# Patient Record
Sex: Female | Born: 1935 | Race: White | Hispanic: No | Marital: Married | Smoking: Never smoker
Health system: Southern US, Community
[De-identification: ages and names within clinical notes are randomized; demographics above are authoritative.]

## PROBLEM LIST (undated history)

## (undated) DIAGNOSIS — G459 Transient cerebral ischemic attack, unspecified: Secondary | ICD-10-CM

## (undated) HISTORY — PX: JOINT REPLACEMENT: SHX530

---

## 1998-05-20 ENCOUNTER — Other Ambulatory Visit: Admission: RE | Admit: 1998-05-20 | Discharge: 1998-05-20 | Payer: Self-pay | Admitting: Endocrinology

## 2005-05-28 ENCOUNTER — Emergency Department (HOSPITAL_COMMUNITY): Admission: EM | Admit: 2005-05-28 | Discharge: 2005-05-28 | Payer: Self-pay | Admitting: Emergency Medicine

## 2005-05-28 ENCOUNTER — Inpatient Hospital Stay (HOSPITAL_COMMUNITY): Admission: RE | Admit: 2005-05-28 | Discharge: 2005-06-01 | Payer: Self-pay | Admitting: Orthopedic Surgery

## 2005-05-28 ENCOUNTER — Ambulatory Visit: Payer: Self-pay | Admitting: Cardiology

## 2005-05-31 ENCOUNTER — Ambulatory Visit: Payer: Self-pay | Admitting: Physical Medicine & Rehabilitation

## 2005-06-01 ENCOUNTER — Inpatient Hospital Stay (HOSPITAL_COMMUNITY)
Admission: RE | Admit: 2005-06-01 | Discharge: 2005-06-10 | Payer: Self-pay | Admitting: Physical Medicine & Rehabilitation

## 2007-10-06 ENCOUNTER — Emergency Department (HOSPITAL_BASED_OUTPATIENT_CLINIC_OR_DEPARTMENT_OTHER): Admission: EM | Admit: 2007-10-06 | Discharge: 2007-10-06 | Payer: Self-pay | Admitting: Emergency Medicine

## 2007-10-10 ENCOUNTER — Ambulatory Visit (HOSPITAL_COMMUNITY): Admission: RE | Admit: 2007-10-10 | Discharge: 2007-10-10 | Payer: Self-pay | Admitting: General Surgery

## 2008-03-13 ENCOUNTER — Encounter: Admission: RE | Admit: 2008-03-13 | Discharge: 2008-03-13 | Payer: Self-pay | Admitting: General Surgery

## 2010-06-02 NOTE — Op Note (Signed)
[name], [name]                  ACCOUNT NO.:  [id number]   MEDICAL RECORD NO.:  [id number]          PATIENT TYPE:  AMB   LOCATION:  SDS                          FACILITY:  [facility]   PHYSICIAN:  Johnette Abraham, MD    DATE OF BIRTH:  04-22-1922   DATE OF PROCEDURE:  10/10/2007  DATE OF DISCHARGE:                               OPERATIVE REPORT   PREOPERATIVE DIAGNOSES:  1. Fracture of the left distal radius.  2. Fracture of the left distal ulna.  3. Median nerve compression.   POSTOPERATIVE DIAGNOSES:  1. Fracture of the left distal radius.  2. Fracture of the left distal ulna.  3. Median nerve compression.   PROCEDURES:  1. Open reduction and internal fixation of the left distal radius with      a volar Stryker VariAx plate.  2. Open reduction and internal fixation of the left distal ulna with a      plate and screws.  3. Open carpal tunnel release.  4. Intraoperative x-ray and fluoroscopy.   SURGEON:  Harrill C. Izora Ribas, MD   ASSISTANTS:  None.   ANESTHESIA:  General.   FINDINGS:  Comminuted intra-articular multi-part distal radius fracture,  comminuted multi-part distal ulnar fracture.   No specimens.   ESTIMATED BLOOD LOSS:  15 mL.   TOURNIQUET TIME:  Just under 2 hours.   No acute complications.   INDICATIONS:  [name] is an 77 year old female who fell on  outstretched hand sustaining a severe fracture to her left distal radius  and ulna.  X-ray examinations revealed comminuted displaced and  osteopenic fracture and bone, this was felt in need of operative  fixation.  Risks, benefits, and alternatives of surgery were discussed  with the patient including swelling, nerve compression, stiff motion,  and fracture nonunion.  The patient agreed to proceed with surgery.  Consent was obtained.   PROCEDURE:  The patient was taken to the operating room, placed supine  on the operating room table.  General anesthesia was administered.  Of  note, the patient had an  infraclavicular block prior to the procedure by  Anesthesia.  After general anesthesia was administered, the left upper  extremity was prepped and draped in normal sterile fashion.  The arm was  elevated and exsanguinated.  Tourniquet was inflated to 250 mmHg.  An  incision approximately 8 cm long was made on the wrist overlying the  flexor carpi radialis tendon.  There was quite a bit of edema  encountered on the dissection.  The flexor carpi radialis tendon was  retracted ulnarly.  The fascia was then opened and the fracture site was  exposed.  The fracture was in multiple parts with a large radial styloid  fragment.  It appeared to be intra-articular.  The bone was very  brittle.  Reduction was assisted with placement of a K-wire through the  radial styloid.  Following an appropriate size Stryker VariAx volar  plate was placed.  X-ray examination revealed central placement on the  shaft and the central screw was drilled and appropriate size screw was  placed.  Additional x-ray views were performed and the plate adjusted to  provide adequate coverage of the fracture and central placement of the  plate.  Once this was performed, additional holes were drilled in the  radial shaft and cortical screws were placed according to the depth  measurements.  Following with the dorsal pressure to reduce the fracture  as well as radial pressure, the distal holes of the plate were drilled,  appropriate size locking screws were placed.  Of note, the bone quality  here was very, very poor and this was why locking screws were used.  Several x-ray examinations revealed good reduction of the fracture and  adequate placement of all screws.  Afterwards the K-wire was removed  from the radial styloid and stable fixation was obtained.  Following the  distal ulnar fracture was palpated, an incision along the midline from  the ulnar styloid proximally for about 4 cm was created.  Care was taken  during the  dissection to avoid any neurovascular structures.  The ulnar  shaft itself was exposed, the fracture site was exposed, it was  irrigated and reduced.  A small radial styloid plate was used and  contoured to fit along the portion of the radius.  X-ray views  demonstrated good plate placement.  Fracture reduction was aided with  temporary K-wire placement following the plate was secured with both  cortical and locking screws that were each measured to depth.  Several x-  ray reviews revealed good reduction of the fragment and adequate length  and placement of all screws.  Afterwards because of the extensive  comminution and swelling, an open carpal tunnel release was performed.  A curvilinear incision was made just ulnar to the opponens flexion  crease.  Dissection was carried down to the palmar fascia.  This was  sharply incised visualizing the distal aspect of the transverse carpal  ligament.  Under direct visualization, this ligament was incised from  distal to proximal.  A Freer elevator was used up underneath the  transverse carpal ligament and on top of the median nerve for  protection.  The entire transverse carpal ligament was transected  proximally, again taking great care to avoid any branches or the main  median nerve.  Following the tourniquet was released, hemostasis was  controlled with bipolar as well as direct pressure.  The distal radius  wound was closed with several layers with interrupted 4-0 Monocryl and  then a subcuticular 4-0 Monocryl stitch.  The ulnar fracture wound was  closed similarly with a deep layer of 4-0 Monocryl and then a  subcuticular 4-0 Monocryl closure.  The carpal tunnel incision was  closed with several interrupted 5-0 nylon sutures.  Afterwards the  wounds were dressed with Xeroform ointment, a sterile dressing, and a  long-arm sugar-tong splint.  The patient tolerated the procedure well,  was taken to the recovery room in stable  condition.      Johnette Abraham, MD  Electronically Signed     HCC/MEDQ  D:  10/11/2007  T:  10/11/2007  Job:  [id number]

## 2010-06-05 NOTE — H&P (Signed)
NAMECAROLAN, AVEDISIAN                  ACCOUNT NO.:  [id number]   MEDICAL RECORD NO.:  [id number]           PATIENT TYPE:   LOCATION:                                 FACILITY:   PHYSICIAN:  Tera Mater. Evlyn Kanner, M.D. DATE OF BIRTH:  20-Nov-1922   DATE OF ADMISSION:  05/28/2005  DATE OF DISCHARGE:                                HISTORY & PHYSICAL   Ms. 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  She presents after tripping over a bedspread at home with pain in her right  leg. Evaluation here has shown that she has a significant femur fracture at  the distal end. There was clearly no syncope or presyncope, lightheadedness  or dizziness prior to this fall. She has had no loss of consciousness at  this time or other times. She has had no slowness going up steps. No dyspnea  on exertion. No chest pain. No palpitations. No tiredness or no slowing down  of her routine activities. When she came to the emergency room, however,  heart rate was in the 43 to 49 range despite pain, and blood pressure was in  the 70s to 80s systolic up to 90. She has no history of cardiac disease or  testing. No established vascular disease and basically has felt well. Her  last routine visit with me was last summer.   PAST MEDICAL HISTORY:  1.  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      RET proto-oncogene was negative in 1996.  2.  Has a history of osteoporosis. In February 2005, a T score at the L      spine was -2.5, at the femoral neck was -2.3.  3.  She had knee effusion thought to be pseudogout in 1995.  4.  History of diverticula.  5.  History of cervical polyps in 1994.  6.  Hyperlipidemia.  7.  Normal carotid studies in 2000.   FAMILY HISTORY:  Father died at 70 of pneumonia. Mother died of stomach  cancer at 23. Sister died of renal disease and heart disease.   MEDICATIONS:  1.  Synthroid 150.  2.  Fosamax plus D weekly.  3.  Os-Cal.  4.  Lipitor 10.  5.  Xalatan eye drops.  6.  Aspirin intermittently.   ALLERGIES:  She has no known drug allergies.   PHYSICAL EXAMINATION:  VITAL SIGNS:  Blood pressure 95/48, pulse 48,  respirations 20, temperature 97.2.  GENERAL:  She is sitting up in no distress. She looks pale but at her usual  baseline.  HEENT:  Sclerae are anicteric. Extraocular movements are intact. No  nystagmus is present. Oral mucous membranes are moist. There is no evidence  of mouth trauma or tongue trauma.  NECK:  Supple with healed thyroid scar. No bruits could be heard.  LUNGS:  Clear without wheezes, rales or rhonchi. No accessory muscles used.  HEART:  Bradycardic and regular with no murmurs appreciated.  ABDOMEN:  Soft, nondistended, nontender with  positive bowel sounds. No  masses or pulsations.  EXTREMITIES:  Reveals pulses to be intact. There is a fairly large right  knee effusion with a bit of angulation. No peripheral edema is present. Nail  bed perfusion is good.  NEUROLOGICAL:  The patient is awake, alert, mentating ___________. speech is  clear. No resting tremors present. Grip is equal bilaterally. There is no  hallucination or delusion. No evidence of confusion.   LABORATORY DATA:  X-ray shows a fracture of the right femur just above the  knee with angulation and displacement. Echocardiogram is sinus brady at 46  with prominent T waves. White count 8200, hemoglobin 12.6, platelets  304,000. Sodium 140, potassium 3.7, chloride 105, CO2 30, BUN 12, creatinine  0.7, glucose 136, calcium 8.9. Troponin less than 0.05. CPK is less than  0.1.   ASSESSMENT:  In summary, we have an 77 year old white female falling with a  leg fracture. There is no evidence of syncope, presyncope or other  neurological problems causing a fall. She is neurologically intact with  significant bradycardia with some hypotension. The slow  heart rate is  clearly out of proportion given the stress and pain of this illness. Blood  pressure did drop a bit with morphine and has come off of it. Will have  cardiology see her, discuss possibly an external or temporary pacemaker if  clinically indicated. Her femur fracture would appear to require surgical  intervention and right now is in a knee immobilizer. Her medullary carcinoma  of the thyroid is surgically cleared, and her osteoporosis is being treated.  There is no evidence of any other metabolic disarray on her labs. We will  check a TSH and cortisol. She will be watched on telemetry.           ______________________________  Tera Mater Evlyn Kanner, M.D.     SAS/MEDQ  D:  05/28/2005  T:  05/28/2005  Job:  [id number]

## 2010-06-05 NOTE — Discharge Summary (Signed)
NAMEEARTHA, [name]                  ACCOUNT NO.:  [id number]   MEDICAL RECORD NO.:  [id number]          PATIENT TYPE:  INP   LOCATION:  5002                         FACILITY:  [facility]   PHYSICIAN:  Dyke Brackett, M.D.    DATE OF BIRTH:  11/30/1922   DATE OF ADMISSION:  05/28/2005  DATE OF DISCHARGE:  06/01/2005                                 DISCHARGE SUMMARY   ADMITTING DIAGNOSES:  1. Right distal femur fracture, intercondylar.  2. Medullary carcinoma of the thyroid, 1992, treated with thyroidectomy.  3. History of osteoporosis.  4. History of knee effusion, thought to be pseudogout, 1995.  5. History of diverticula.  6. History of cervical polyps, 1994.  7. Hyperlipidemia.  8. Normal carotid studies in 2000.   DISCHARGE DIAGNOSES:  1. Status post retrograde IML, right distal femur fracture.  2. Acute blood loss anemia secondary to surgery, requiring blood      transfusion.  3. History of medullary carcinoma of thyroid, treated with thyroidectomy      in 1992.  4. History of osteoporosis.  5. History of possible pseudogout of the knee.  6. History of diverticula.  7. History of cervical polyps.  8. Hyperlipidemia.  9. Normal carotid studies in 2000.   HPI:  [name] is an 77 year old, healthy, white female with a history of  medullary carcinoma of thyroid, cured with thyroidectomy, osteoporosis,  hyperlipidemia.  The patient presents to the ER after tripping over her  bedspread at home, resulting in pain of the right leg.  X-rays showed a  distal right femur fracture.  Patient denied any syncope, presyncope,  lightheadedness or dizziness prior to fall.  She had no loss of  consciousness.  Patient denied any dyspnea or edema, no chest pain.  She was  evaluated by Dr. Adrian Prince preoperatively.  The patient was eventually  cleared for surgery and was taken later that day to the OR for a retrograde  IML of the right distal femur fracture.   ALLERGIES:  NO KNOWN DRUG  ALLERGIES.   MEDS:  Current meds:  1. Synthroid 150 mcg daily.  2. Fosamax weekly.  3. Lipitor 10 mg daily.  4. Aspirin.  5. Xalatan eye drops.   SURGICAL PROCEDURE:  The patient was taken to the operating room on May 28, 2005 by Dr. Madelon Lips, assisted by Richardean Canal PA-C.  The patient was placed  under general anesthesia and an ACE retrograde nailing of the femur fracture  was performed using a 40 x 13 mm nail with __________ .  The patient  tolerated the procedure well and returned to recovery in good and stable  condition.   CONSULTS:  The following consults were obtained while the patient was  hospitalized:  1. Medicine doctor, Dr. Adrian Prince.  2. Cardiology, Rollene Rotunda.  3. PT, OT, case management, rehab.   HOSPITAL COURSE:  Postop day 1, patient afebrile.  Vital signs stable, foot  neurovascularly intact.  Patient was on telemetry due to bradycardia  hypertension in the ER that was later felt to be due to receiving  morphine  with no events on telemetry.   Postop day 2, patient had no chest pain, no shortness of breath, no calf  pain.  A T-Max of 99.7; otherwise, vital signs stable.  H&H is 6.9 and 20;  therefore, the patient was transfused packed red blood cells.  Lovenox was  held and on exam, patient's right knee with +2 to 3 edema, minimal drainage  from the wound site.  Patient remained in normal sinus rhythm and no  episodes of bradycardia or hypotension were noted; therefore, telemetry was  discontinued.   Postop day 3, patient without chest pain, shortness of breath, calf pain.  T-  Max was 100.2, vital signs all stable.  H&H is 8.9 and 25.5.  Right knee  incision well approximated with staples.  No drainage, +2 edema, continue  ACE wrap.  Lovenox continued to be on hold.   Postop day 4, patient without chest pain, shortness of breath, nausea or  vomiting.  Prognosis with physical therapy.  Patient on Trinsicon due to  acute blood loss anemia secondary  to surgery.  The patient was asymptomatic  in regards to anemia.  Patient's platelets were 179.  A prior mild  thrombocytopenia.  Platelets at 138.  Due to patient's slow progress with  physical therapy, inpatient rehab was suggested and the patient had been  evaluated by rehab physicians.  Later that day, a bed became available and  patient was transferred to [facility].  CBC and potassium repeated  in the a.m.  The patient did have mild hypokalemia with a past potassium of  3.4 on postop day number 4.  Otherwise, the patient transferred to rehab in  good, stable condition.   LABS:  Routine labs on admission:  White count was 8,200, hemoglobin 12.6,  hematocrit 37.8, platelets were 304.   Sodium 140, potassium 3.7, chloride 105, bicarb was set at 30, glucose 136,  BUN 12, creatinine 0.7, calium 8.9.   EKG on admission, dated May 28, 2005, showed marked sinus bradycardia with a  heart rate of 46 beats per minute, PR interval 152 milliseconds, QRT axis  66, 72, 69.   X-rays, chest one-view dated May 28, 2005 showed borderline cardiomegaly  without edema.   Four-views of right knee dated May 28, 2005 showed a distal right femur  fracture, patella __________ .   Two-views of right femur, postop, showed status post ORIF.  The right femur  hardware components in anatomic alignment, no complicating features  identified.   MEDS:  Meds on floor:  1. Levothyroxine 150 mcg p.o. daily.  2. Xalatan ophthalmic drops 1 OP every 20:00 hours.  3. Phenergan 12.5 mg IV q.6 h. p.r.n. nausea.  4. Calcium carbonate 500 mg p.o. b.i.d.  5. Lovenox 40 mg subcu daily, started May 29, 2005 at 8 a.m. to be given      for a total of 14 days.  6. Skelaxin 400 to 800 mg p.o. q.6 h. p.r.n. spasm.  7. Vicodin 5/500 one to two tablets p.o. q.4-6 h. p.r.n. pain.  8. Lipitor 10 mg p.o. daily.  9. Trinsicon 1 capsule p.o. b.i.d.   DISCHARGE INSTRUCTIONS: 1. The patient was discharged to rehab on  postop day 4 in good, stable      condition.  2. Meds may be adjusted by rehab physician.  3. Weight bearing status:  The patient is still touchdown weightbearing      right leg with a knee immobilizer and walker.  4. Wound  instructions:  The patient to keep wound clean and dry, change      dressing daily.  Call office if temperature greater than 101.5, mild      swelling and drainage or any signs of infection.  5. Followup:  Patient needs follow up with Dr. Madelon Lips in the office in      approximately 14 days postop.  Patient to [place] at [phone] for      appointment.   CONDITION ON DISCHARGE TO REHAB:  Patient is discharged to rehab in good,  stable condition.  CBC and potassium labs were to be drawn postop day 5,  patient was in rehab.      Richardean Canal, Arnetha Courser, M.D.  Electronically Signed    GC/MEDQ  D:  08/18/2005  T:  08/18/2005  Job:  [id number]   cc:   Dyke Brackett, M.D.

## 2010-06-05 NOTE — Consult Note (Signed)
NAMEBEVERLY, [name]                  ACCOUNT NO.:  [id number]   MEDICAL RECORD NO.:  [id number]          PATIENT TYPE:  INP   LOCATION:  4739                         FACILITY:  [facility]   PHYSICIAN:  Rollene Rotunda, M.D.   DATE OF BIRTH:  04/25/1922   DATE OF CONSULTATION:  05/28/2005  DATE OF DISCHARGE:                                   CONSULTATION   REFERRING PHYSICIAN:  Tera Mater. Evlyn Kanner, M.D.   REASON FOR CONSULTATION:  Evaluate the patient with bradycardia and  hypotension.  She has a broken leg and needs surgical repair.   HISTORY OF PRESENT ILLNESS:  The patient is a lovely, 77 year old, white  female without a prior cardiac history.  She was in her usual state of good  health.  She fell today tripping on some bed clothing.  She has apparently  fractured her right femur and needs surgical repair.  The patient had no  syncope.  In the emergency room, the patient was noted to have some resting  bradycardia with heart rates in the 40s and 50s.  This has been sinus.  She  did have hypotension, but this appears this was after morphine.  She dropped  her pressure into the 80s, but it responded very quickly to fluid hydration.  EKG has been otherwise unremarkable except for the sinus bradycardia.  While  talking to her and minimal movement in the bed, her heart rate goes up  easily into the 70s-80s.   The patient is active, although she does not exercise routinely.  She does  some activities around her house and gets around without difficulty.  With  this level of activity, she has never had any symptoms of lightheadedness,  presyncope or syncope.  She denies any orthostatic symptoms.  She has never  had any chest discomfort, neck discomfort, arm discomfort, activity-induced  nausea, vomiting or diaphoresis.  She has never had any PND or orthopnea.  She does not report any cardiac testing other than carotid ultrasound which  demonstrated no obstructive disease with the last one being  about 3-4 years  ago.   PAST MEDICAL HISTORY:  1.  Hyperlipidemia.  2.  Medullary thyroid cancer.  3.  Questionable diverticulosis.  4.  Pseudogout.  5.  Osteoporosis.  6.  Questionable cervical problems (the patient does not recall the      history).   PAST SURGICAL HISTORY:  1.  Thyroidectomy.  2.  Fluid drained from her knees.   ALLERGIES:  No known drug allergies.   CURRENT MEDICATIONS:  1.  Synthroid 150 mcg daily.  2.  Fosamax weekly.  3.  Lipitor 10 mg a day.  4.  Xalatan eye drops.  5.  Aspirin.   SOCIAL HISTORY:  The patient lives in [place] with her husband.  She  lives in her daughters house.  She is a retired Film/video editor.  She never  smoked cigarettes and does not drink alcohol.   FAMILY HISTORY:  Contributory for sister dying at age 58 of kidney problems.  Otherwise, there are no first-degree relatives with early onset coronary  disease, congestive heart failure or syncope.   REVIEW OF SYSTEMS:  NEUROLOGIC:  Mild depression recently.  HEENT:  History  of nasal bleeding, reading glasses.  Negative for all other systems.   PHYSICAL EXAMINATION:  GENERAL:  The patient is in no distress.  VITAL SIGNS:  Blood pressure currently 113/58, heart rate 60s and regular.  HEENT:  Pupils equal round and reactive to light.  Fundi not visualized.  Oral mucosa unremarkable.  NECK:  No jugular venous distention.  Carotid upstroke brisk and symmetric.  Left carotid bruit.  Soft right carotid bruit.  No thyromegaly.  LYMPHS:  No cervical, axillary, inguinal adenopathy.  LUNGS:  Clear to auscultation bilaterally without wheezing, dullness to  percussion or crackles.  BACK:  No costovertebral angle tenderness.  CHEST:  Unremarkable.  HEART:  PMI nondisplaced.  S1, S2 within normal limits.  No S3, S4.  A 2/6  apical systolic murmur, very brief, no diastolic murmurs.  ABDOMEN:  Flat, positive bowel sounds, normal in frequency and pitch.  No  bruits, rebound, guarding.  No  hepatosplenomegaly.  SKIN:  No rashes.  EXTREMITIES:  2+ upper pulses, 2+ femorals without bruits.  Her right knee  is in a brace.  She has normal left popliteal, normal dorsalis pedis and  posterior tibialis bilaterally.  No cyanosis, clubbing or edema.  NEUROLOGIC:  Oriented to person, place and time.  Cranial nerves 2-12  grossly intact.  Motor grossly intact.   LABORATORY DATA AND X-RAY FINDINGS:  EKG with sinus bradycardia, rate 46,  axis within normal limits, intervals within normal limits.  No ST and T wave  changes.   WBC 8.2, hemoglobin 12.6, platelets 304.  Sodium 140, potassium 3.7, BUN 12,  creatinine 0.7.  Point of care markers negative x1.   ASSESSMENT:  1.  Bradycardia/hypertension.  The patient has a baseline bradycardia, but      she seems to be competent raising her heart rate with minimal activity.      She has never had any symptoms consistent with symptomatic      bradyarrhythmias.  She did drop her pressure, but was in pain and      received some morphine.  This responded quickly to hydration.  She has      no high-risk features.  She is an active person.  2.  Carotid bruits.  The patient reports having carotid ultrasound a couple      of times in the past.  She thinks the last one was about 3-4 years ago      and there was no obstruction.  This can be followed up as an outpatient.  3.  We will follow her through this hospitalization.   RECOMMENDATIONS:  She is going in for a moderate risk procedure.  The  procedure is of a somewhat urgent nature for pain management.  Given all of  this, based on the ACC/AHA guidelines, the patient has no indication for  further cardiovascular testing prior to surgery.   She would need to be followed on telemetry.  We need to be prepared with  atropine if she does have symptomatic bradyarrhythmias.  We can be called for any temporary pacemaker placement.  She can also have transcutaneous  pacing as needed, although I doubt  this would be necessary.           ______________________________  Rollene Rotunda, M.D.     JH/MEDQ  D:  05/28/2005  T:  05/29/2005  Job:  [id number]  cc:   Dyke Brackett, M.D.  Fax: [phone]

## 2010-06-05 NOTE — Op Note (Signed)
[name], [name]                  ACCOUNT NO.:  [id number]   MEDICAL RECORD NO.:  [id number]          PATIENT TYPE:  INP   LOCATION:  4739                         FACILITY:  [facility]   PHYSICIAN:  Dyke Brackett, M.D.    DATE OF BIRTH:  Sep 19, 1922   DATE OF PROCEDURE:  05/28/2005  DATE OF DISCHARGE:                                 OPERATIVE REPORT   PREOPERATIVE DIAGNOSIS:  Displaced distal femur fracture, intercondylar.   POSTOPERATIVE DIAGNOSIS:  Displaced distal femur fracture, intercondylar.   OPERATION:  Ace retrograde nail (40 x 13 mm nail with distal interlock).   SURGEON:  Dyke Brackett, M.D.   ASSISTANT:  Legrand Pitts. Duffy, P.A.   ESTIMATED BLOOD LOSS:  Approximately 100 mL.   DESCRIPTION OF PROCEDURE:  Sterile prep and drape.  Triangles were used.  The incision was made just inferior to the patella to split the patellar  tendon over about 3-4 cm.  A guide pin was placed nearing the anatomic  center relative to the medial and lateral dimensions of the femur near or  slightly superior to the attachment of the PCL.  This appeared to be in good  position.  A guide pin was placed past the fracture site, which was reduced.  This was reamed up at 13.5 mm to accept the dimensions of the distal portion  of the nail.  Again progressive reaming carried out, measured to be  appropriately lengthened at 40, reamed up to 14.5 mm to accept a 13 mm  diameter nail.  Rod was inserted with confirmation of the guide pin  placement in AP and lateral planes with the C-arm.  The length was judged to  be excellent.  A distal interlocking screw was placed.  The third screw,  meaning the most proximal, could not be used as it was right at the fracture  site, but the two distal screws were placed without difficulty, particularly  the distal screw bit well.  The second screw more proximally did take some  purchase.  Again, this created good rotational stability of the leg and in  light of the large  diameter nail, it was elected not to perform a proximal  interlock.  Good reduction of the fracture was noted in the AP and lateral  plane.  The wound was irrigated.  Th patellar tendon was closed with 0  Vicryl, 2-0 on the subcutaneous tissues, and staples on the skin, Marcaine  with epinephrine on the skin, a lightly compressive sterile dressing  applied.      Dyke Brackett, M.D.  Electronically Signed     WDC/MEDQ  D:  05/28/2005  T:  05/29/2005  Job:  [id number]

## 2010-06-05 NOTE — Discharge Summary (Signed)
NAMESONIYAH, MCGLORY                  ACCOUNT NO.:  [id number]   MEDICAL RECORD NO.:  [id number]          PATIENT TYPE:  IPS   LOCATION:  4149                         FACILITY:  [facility]   PHYSICIAN:  Erick Colace, M.D.DATE OF BIRTH:  August 10, 1922   DATE OF ADMISSION:  06/01/2005  DATE OF DISCHARGE:  06/10/2005                                 DISCHARGE SUMMARY   DISCHARGE DIAGNOSES:  1.  Right distal femur fracture, status post intramedullary nail May 28, 2005, pain management, subcutaneous Lovenox for deep vein thrombosis      prophylaxis.  2.  Postoperative anemia.  3.  Hyperlipidemia.  4.  Hypothyroidism.  5.  Osteoporosis.   An 77 year old female, admitted May 11 after a fall without loss  consciousness, sustained a right distal femur fracture.  She underwent  intramedullary nailing May 11 per Dr. Madelon Lips.  Subcutaneous Lovenox for  deep vein thrombosis prophylaxis, touch-down weightbearing with knee  immobilizer.  Postoperative anemia 6.9, transfused 2 units of packed red  blood cells May 13 with hemoglobin improved to 8.9.  She had no chest pain,  no shortness of breath, no nausea or vomiting.  She was admitted for  comprehensive rehab program.   PAST MEDICAL HISTORY:  See discharge diagnoses.  No alcohol or tobacco.   ALLERGIES:  NONE.   SOCIAL HISTORY:  Lives with husband, good support of family, one level home,  one step entry.   MEDICATIONS PRIOR TO ADMISSION:  1.  Synthroid 0.125 mg daily.  2.  Lipitor 10 mg daily.  3.  Fosamax weekly.  4.  Eye drops daily.   REHABILITATION HOSPITAL COURSE:  The patient was admitted to inpatient rehab  services with therapies initiated on a b.i.d. basis consisting of physical  therapy, occupational therapy, and rehabilitation nursing.  The following  issues were addressed during the patient's rehabilitation stay.  Pertaining  to Mrs. Mckendry right distal femur fracture, she had undergone intramedullary  nailing May 28, 2005, surgical site healing nicely.  She was touch-down  weightbearing with knee immobilizer.  Her mobility was limited due to these  weightbearing restrictions.  She underwent full wheelchair mobility,  transfer training, as well as stairs.  Pain management ongoing with the use  of Vicodin and Robaxin with good results.  She remained on subcutaneous  Lovenox for deep vein thrombosis prophylaxis throughout her rehab course.  Her calves remained cool without any swelling, erythema, nontender.  Postoperative anemia stable, with latest hemoglobin 8.6, hematocrit 25.1.  She remained on a multivitamin.  She will continue her Lipitor as well as  Synthroid as prior to hospital admission.  She had no bowel or bladder  disturbances throughout her rehab course.  Functionally, she was independent  for her wheelchair mobility, needing assistance for lower body activities of  daily living due to limited mobility with knee immobilizer and weightbearing  status.  Home health therapies would be ongoing.  A ramp was to be completed  to the home.  Family underwent full family teaching prior to discharge.   Latest labs showed  a sodium of 137, potassium 3.8, BUN 5, creatinine 0.6.  Hemoglobin 8.6, hematocrit 25.1, platelets 195,000.   DISCHARGE MEDICATIONS:  At the time of dictation included:  1.  Lipitor 10 mg daily.  2.  Synthroid 137 mcg daily.  3.  Xalatan opthalmic solution one drop both eyes at bedtime.  4.  Os-Cal 500 mg twice daily.  5.  Multivitamin daily.  6.  Robaxin 500 mg every 6 hours as needed.  7.  Vicodin 5/500 one or two tablets every 4 hours as needed for pain.   ACTIVITY:  Was touch-down weightbearing with knee immobilizer.   Followup hemoglobin was noted to be 9.7, hematocrit 28 on Jun 09, 2005.  This initially had not been listed in the labs in the existing chart.  This  showed an improvement from 8.6.  She will follow up with Dr. Madelon Lips,  orthopedic services, as advised, and Dr.  Evlyn Kanner, medical management.      Mariam Dollar, P.A.      Erick Colace, M.D.  Electronically Signed    DA/MEDQ  D:  06/09/2005  T:  06/09/2005  Job:  [id number]   cc:   Dyke Brackett, M.D.  Fax: [phone]   Tera Mater. Evlyn Kanner, M.D.  Fax: [phone]

## 2010-06-05 NOTE — H&P (Signed)
NAMECOLIE, JOSTEN                  ACCOUNT NO.:  [id number]   MEDICAL RECORD NO.:  [id number]          PATIENT TYPE:  IPS   LOCATION:  4149                         FACILITY:  [facility]   PHYSICIAN:  Erick Colace, M.D.DATE OF BIRTH:  Jun 07, 1922   DATE OF ADMISSION:  06/01/2005  DATE OF DISCHARGE:                                HISTORY & PHYSICAL   REASON FOR ADMISSION:  Right femur fracture with decline in self care and  mobility skills.   HISTORY:  An 77 year old female admitted May 28, 2005, after a fall  resulting in right distal femur fracture.  She had no loss of consciousness.  She underwent IM nailing per Dr. Madelon Lips on May 28, 2005.  She was placed on  subcutaneous Lovenox for DVT prophylaxis and placed on touchdown  weightbearing in knee immobilizer.  Estimated duration of anticoagulation is  2 weeks from time of onset.  Postoperatively had acute blood loss anemia  with hemoglobin dropping down to 6.9. She was transfused 2 units of packed  red blood cells, and her hemoglobin went up to 8.9.  This was on May 30, 2005.   REVIEW OF SYSTEMS:  Positive for reflux as well as joint swelling.  She also  states she has some arthritis in the left knee and has had injections in  that limb before.   PAST MEDICAL HISTORY:  1.  Hypothyroidism with thyroid surgery.  2.  Hyperlipidemia.  3.  Osteoporosis.  4.  Cervical polyp removed in 1994.   HABITS:  Negative ETOH, negative tobacco.   FAMILY HISTORY:  Positive CAD.   SOCIAL HISTORY:  Lives with husband, good support with husband and family.  One-level home, one step to enter.   FUNCTIONAL HISTORY:  Independent prior to admission.   CURRENT FUNCTIONAL STATUS:  Needs physical assistance for ADLs and mobility.   MEDICATIONS AT HOME:  1.  Synthroid 0.125 mg p.o. daily.  2.  Lipitor 10 mg p.o. daily.  3.  Fosamax weekly.  4.  Xalatan ophthalmic solution daily.   ALLERGIES:  None known.   CURRENT MEDICATIONS:  1.   Subcutaneous Lovenox 40 mg daily.  2.  Os-Cal 500 twice daily.  3.  Lipitor 10 mg p.o. daily.  4.  Synthroid 137 mcg p.o. daily.  5.  Trinsicon 1 p.o. twice daily.  6.  Robaxin 500 mg p.o. q. 6 h p.r.n. spasm.   PHYSICAL EXAMINATION:  VITAL SIGNS:  Blood pressure 112/62, pulse 88,  respirations 18, temperature 99.1.  GENERAL: Elderly female in no acute distress.  She is thin but not frail  appearing.  HEENT:  Eyes anicteric, not injected.  External ENT normal.  NECK: Supple without adenopathy.  LUNGS:  Respiratory effort is good.  Lungs are clear.  HEART: Regular rate and rhythm.  No murmurs, rubs, extra sounds.  ABDOMEN: Positive bowel sounds. Soft, nontender to palpation.  EXTREMITIES: No clubbing, cyanosis, or edema.  She does have effusion at the  right knee graded as mild to moderate.  Her incision is with surgical clips  and no evidence of erythema  or drainage.  Her left knee has some bony  swelling but no knee effusion, no calf edema, no pretibial edema, no calf  tenderness to palpation. She has full range of motion of the ankles, lacks  some flexion at the left knee. Right knee not tested secondary to her range  of motion restrictions.  Motor strength bilateral upper extremities is 5-/5  in deltoids, triceps, grip.   IMPRESSION:  1.  Functional deficits due to right distal femur fracture status post      intramedullary nailing May 28, 2005, postoperative day #4.  We will keep      the knee immobilizer on at all times, dressing changes daily.  2.  Pain management with Robaxin and Vicodin p.r.n.  3.  Deep vein thrombosis prophylaxis with subcutaneous Lovenox daily until      May 21 or May 25 if still here.  4.  Postoperative anemia. Follow up CBC.  5.  Hyperlipidemia.  Continue Lipitor.  6.  Hypothyroidism.  Continue Synthroid.  7.  Osteoporosis. Continue Os-Cal.  She is on Fosamax at home.   ESTIMATED LENGTH OF STAY:  7 to 10 days.  The patient is a good rehab   candidate.   GOALS:  Supervision level for ADLs and mobility.      Erick Colace, M.D.  Electronically Signed     AEK/MEDQ  D:  06/01/2005  T:  06/01/2005  Job:  [id number]   cc:   Dyke Brackett, M.D.  Fax: [phone]   Tera Mater. Evlyn Kanner, M.D.  Fax: [phone]

## 2010-07-11 IMAGING — CR DG WRIST COMPLETE 3+V*L*
3 series · 3 of 3 positions shown · non-contrast
Comparison: Left wrist radiographs of 10/06/2007.

CLINICAL DATA: Lateral wrist pain.  History of fractures with ORIF.

LEFT WRIST - COMPLETE 3+ VIEW

[x wrist pa left (1 of 2)]
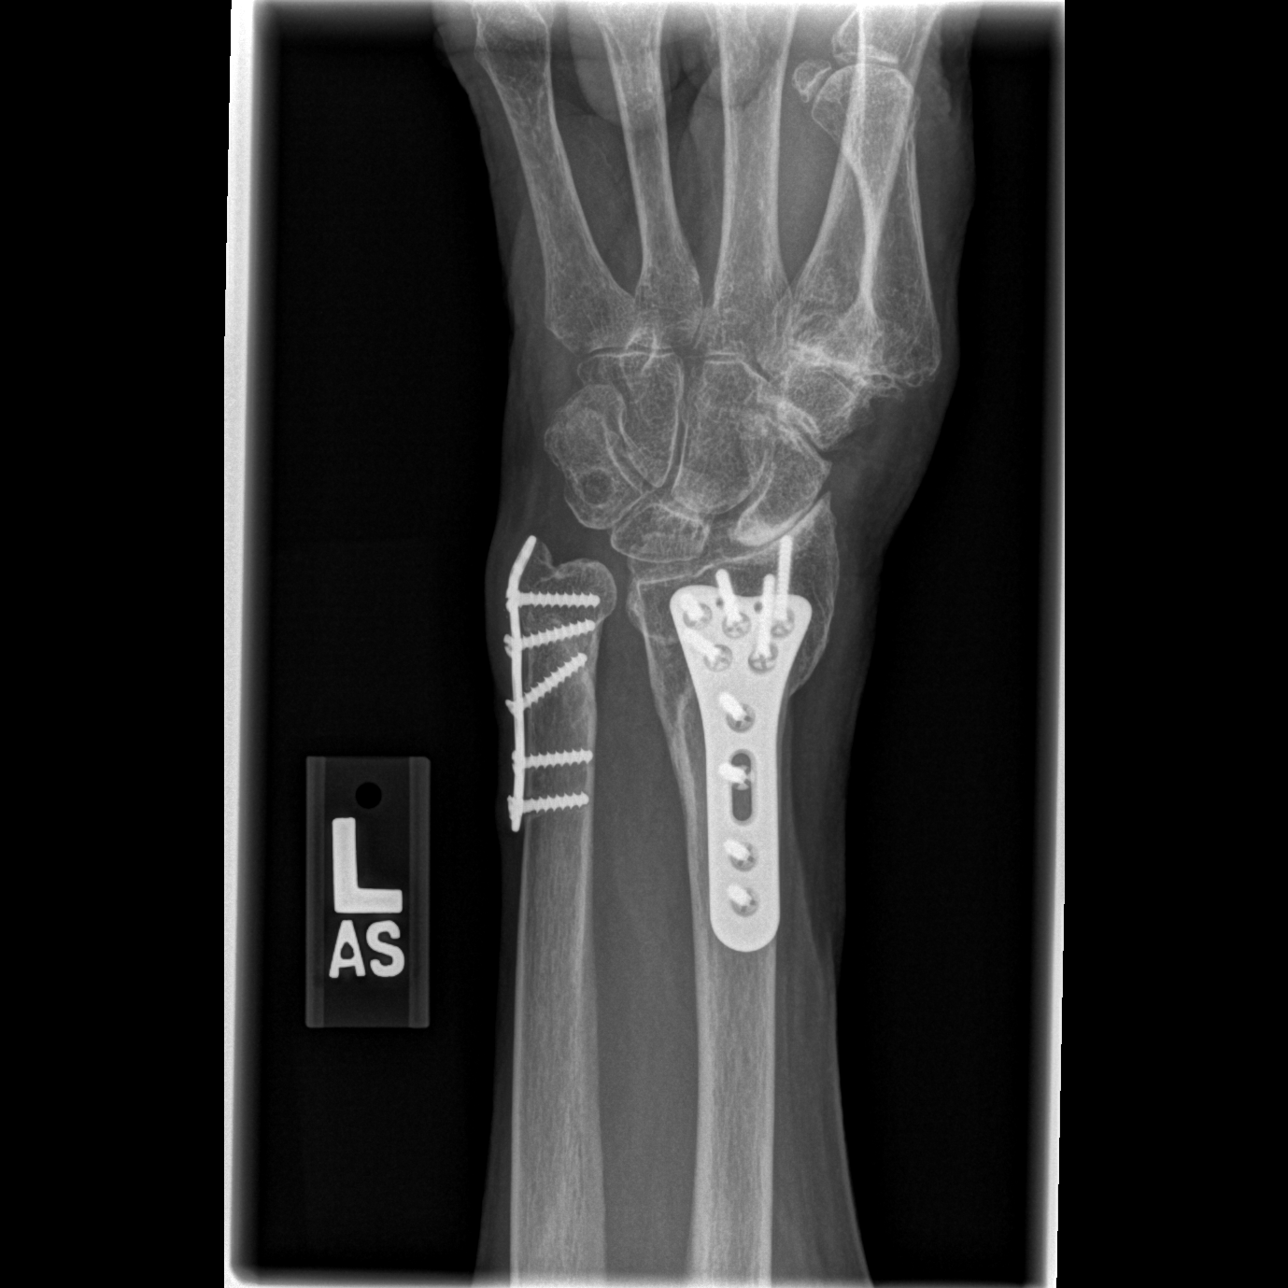

[x wrist pa left (2 of 2)]
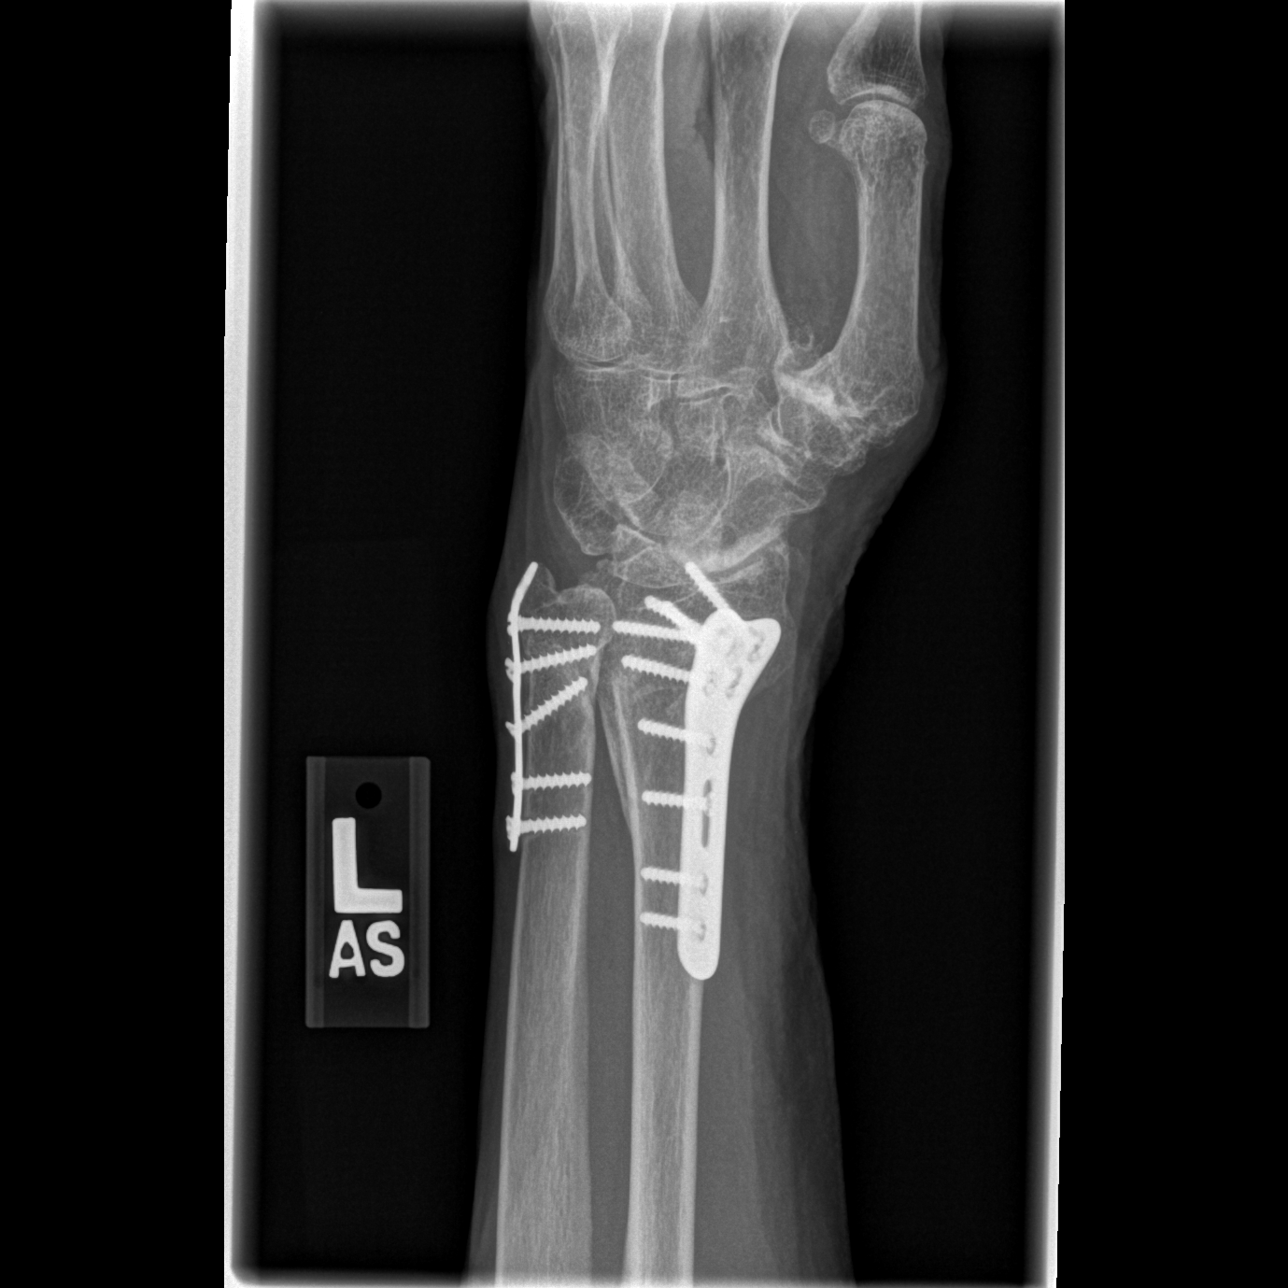

[x wrist lat left]
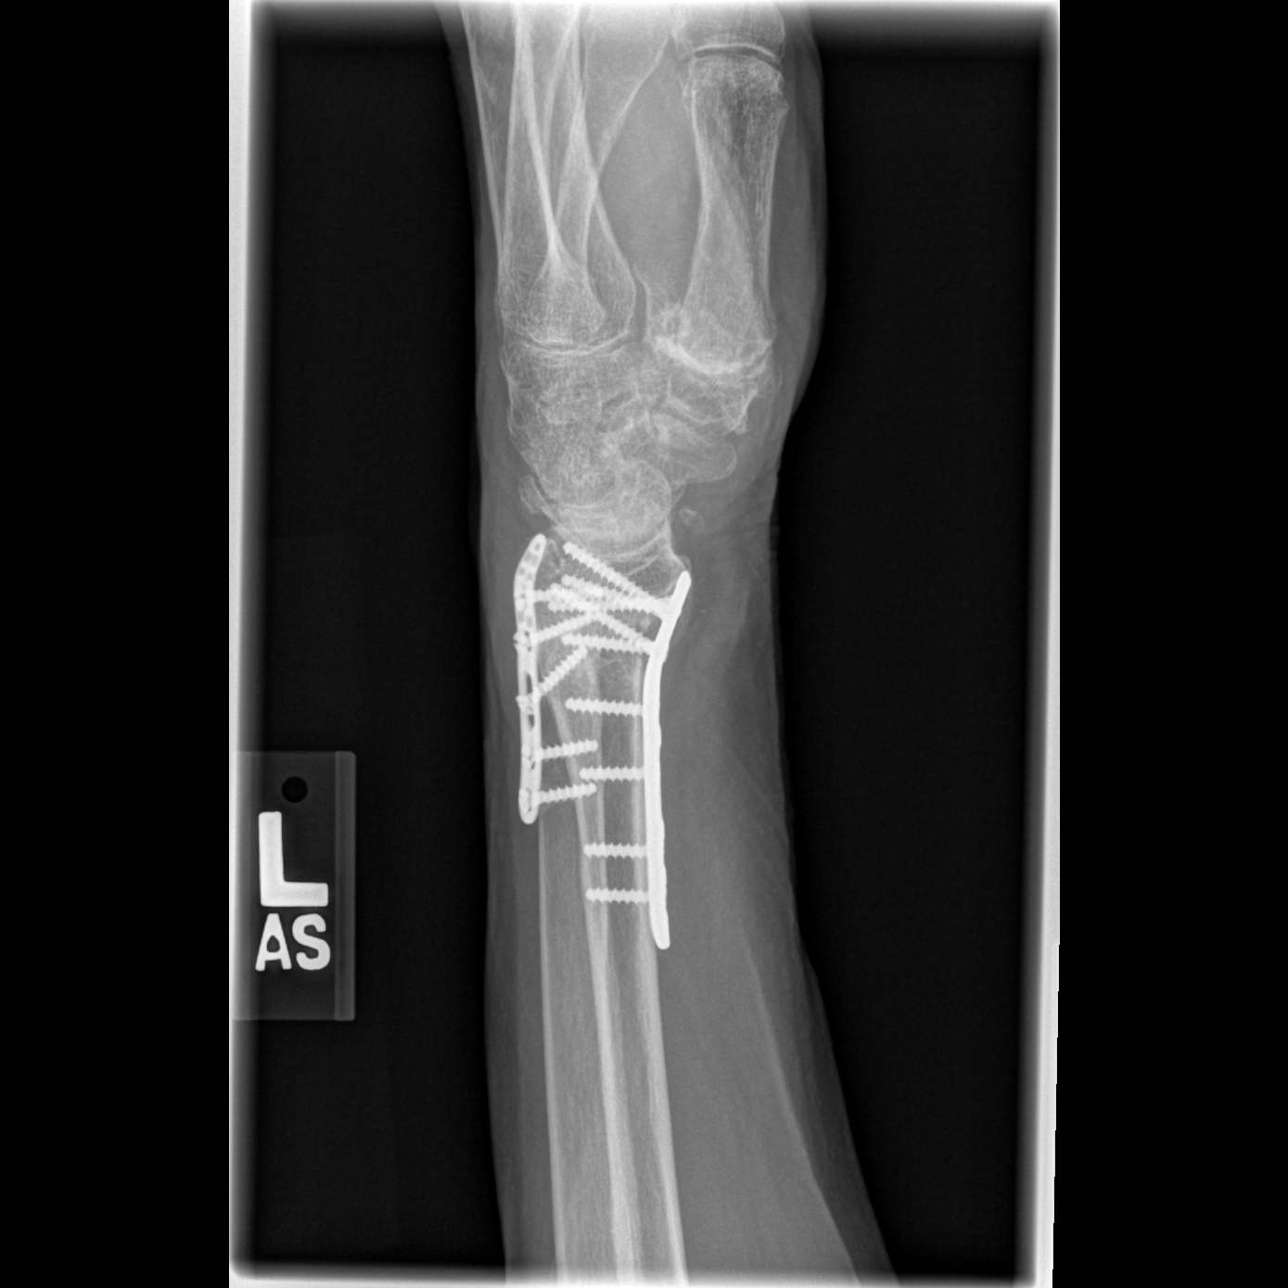

[3 of 3 positions shown; findings below may reference images not displayed]

FINDINGS: There has been interval plate and screw fixation of the
comminuted and displaced fractures of the distal radius and ulna
demonstrated on the prior examination.  These fractures appear well
healed.  There is post-traumatic deformity of the distal radius.
One of the radial screws protrudes towards the distal cortex, but
does not clearly extend into the joint.  There is no evidence of
acute fracture or dislocation.  There is advanced radiocarpal joint
space loss.  In addition, there are advanced degenerative changes
at the base of the first metacarpal.  There is widening of the
scapholunate interval and slight proximal migration of the
capitate.  There is a prominent intraosseous cyst in the
triquetrum.
IMPRESSION: 1.  Interval healing of distal radial and ulnar fractures post
ORIF.  There is distal radial post-traumatic deformity.
2.  No acute osseous findings.
3.  Diffuse degenerative changes as described.  Widened
scapholunate interval is compatible with an underlying scapholunate
ligament tear.

## 2010-10-19 LAB — BASIC METABOLIC PANEL
BUN: 5 — ABNORMAL LOW
BUN: 17
CO2: 26
Chloride: 103
GFR calc non Af Amer: >60
GFR calc non Af Amer: >60
Glucose, Bld: 137 — ABNORMAL HIGH
Potassium: 3.7

## 2010-10-19 LAB — CBC
HCT: 33.5 — ABNORMAL LOW
HCT: 37
Hemoglobin: 11.5 — ABNORMAL LOW
Hemoglobin: 12.6
MCV: 88.1
Platelets: 198
RBC: 3.81 — ABNORMAL LOW
RDW: 13.4
WBC: 9

## 2010-10-19 LAB — DIFFERENTIAL
Basophils Relative: 0
Eosinophils Absolute: 0.1
Eosinophils Relative: 2
Lymphs Abs: 1.7
Neutro Abs: 6.6
Neutrophils Relative %: 73

## 2014-12-16 ENCOUNTER — Emergency Department (HOSPITAL_BASED_OUTPATIENT_CLINIC_OR_DEPARTMENT_OTHER)
Admission: EM | Admit: 2014-12-16 | Discharge: 2014-12-16 | Disposition: A | Discharge status: Home / Self Care | Payer: Medicare Other | Attending: Emergency Medicine | Admitting: Emergency Medicine

## 2014-12-16 ENCOUNTER — Encounter (HOSPITAL_BASED_OUTPATIENT_CLINIC_OR_DEPARTMENT_OTHER): Payer: Self-pay | Admitting: Emergency Medicine

## 2014-12-16 DIAGNOSIS — Z79899 Other long term (current) drug therapy: Secondary | ICD-10-CM | POA: Insufficient documentation

## 2014-12-16 DIAGNOSIS — H9193 Unspecified hearing loss, bilateral: Secondary | ICD-10-CM | POA: Diagnosis present

## 2014-12-16 DIAGNOSIS — H6123 Impacted cerumen, bilateral: Secondary | ICD-10-CM | POA: Diagnosis not present

## 2014-12-16 DIAGNOSIS — Z8673 Personal history of transient ischemic attack (TIA), and cerebral infarction without residual deficits: Secondary | ICD-10-CM | POA: Insufficient documentation

## 2014-12-16 DIAGNOSIS — Z7982 Long term (current) use of aspirin: Secondary | ICD-10-CM | POA: Insufficient documentation

## 2014-12-16 HISTORY — DX: Transient cerebral ischemic attack, unspecified: G45.9

## 2014-12-16 MED ORDER — DOCUSATE SODIUM 100 MG PO CAPS
ORAL_CAPSULE | ORAL | Status: AC
  Filled 2014-12-16: qty 2

## 2014-12-16 MED ORDER — DOCUSATE SODIUM 50 MG/5ML PO LIQD
50.0000 mg | Freq: Every day | ORAL | Status: DC
  Filled 2014-12-16: qty 10

## 2014-12-16 MED ORDER — DOCUSATE SODIUM 100 MG PO CAPS
200.0000 mg | ORAL_CAPSULE | Freq: Every day | ORAL | Status: DC
  Administered 2014-12-16: 200 mg via ORAL

## 2014-12-16 NOTE — ED Provider Notes (Signed)
CSN: [id number]     Arrival date & time 12/16/14  1018 History   First MD Initiated Contact with Patient 12/16/14 1029     Chief Complaint  Patient presents with  . Hearing Problem     (Consider location/radiation/quality/duration/timing/severity/associated sxs/prior Treatment) Patient is a 81 y.o. female presenting with plugged ear sensation. The history is provided by the patient.  Ear Fullness This is a new problem. The current episode started 6 to 12 hours ago. The problem occurs constantly. The problem has not changed since onset.Pertinent negatives include no chest pain, no abdominal pain, no headaches and no shortness of breath. Nothing aggravates the symptoms. Nothing relieves the symptoms. She has tried nothing for the symptoms. The treatment provided no relief.    81 yo F with a chief complaints of loss of hearing. This happened suddenly about 7 hours ago. Patient states that she was going to the bathroom and then realized she couldn't hear anything. Denies head injury denies fevers or chills. Denies unilateral weakness denies difficulty with speech denies difficulty with smell. Patient has difficulty with a relation at baseline but does not feel like that is changed significantly. Denies dizziness. States that she has a feeling like her ears are full bilaterally that there is a popping sensation in them.  Past Medical History  Diagnosis Date  . TIA (transient ischemic attack)    Past Surgical History  Procedure Laterality Date  . Joint replacement     History reviewed. No pertinent family history. Social History  Substance Use Topics  . Smoking status: Never Smoker   . Smokeless tobacco: None  . Alcohol Use: No   OB History    No data available     Review of Systems  Constitutional: Negative for fever and chills.  HENT: Negative for congestion and rhinorrhea.   Eyes: Negative for redness and visual disturbance.  Respiratory: Negative for shortness of breath and  wheezing.   Cardiovascular: Negative for chest pain and palpitations.  Gastrointestinal: Negative for nausea, vomiting and abdominal pain.  Genitourinary: Negative for dysuria and urgency.  Musculoskeletal: Negative for myalgias and arthralgias.  Skin: Negative for pallor and wound.  Neurological: Negative for dizziness and headaches.      Allergies  Review of patient's allergies indicates no known allergies.  Home Medications   Prior to Admission medications   Medication Sig Start Date End Date Taking? Authorizing Provider  aspirin 81 MG tablet Take 81 mg by mouth daily.   Yes Historical Provider, MD  cholecalciferol (VITAMIN D) 1000 UNITS tablet Take 1,000 Units by mouth daily.   Yes Historical Provider, MD  levothyroxine (SYNTHROID, LEVOTHROID) 100 MCG tablet Take 100 mcg by mouth daily before breakfast.   Yes Historical Provider, MD  simvastatin (ZOCOR) 20 MG tablet Take 20 mg by mouth daily.   Yes Historical Provider, MD   BP 148/76 mmHg  Pulse 68  Temp(Src) 97.9 F (36.6 C) (Oral)  Resp 20  Ht  (1.626 m)  Wt 170 lb (77.111 kg)  BMI 29.17 kg/m2  SpO2 96% Physical Exam  Constitutional: She is oriented to person, place, and time. She appears well-developed and well-nourished. No distress.  HENT:  Head: Normocephalic and atraumatic.  Bilateral wax impaction  Eyes: EOM are normal. Pupils are equal, round, and reactive to light.  Neck: Normal range of motion. Neck supple.  Cardiovascular: Normal rate and regular rhythm.  Exam reveals no gallop and no friction rub.   No murmur heard. Pulmonary/Chest: Effort normal. She  has no wheezes. She has no rales.  Abdominal: Soft. She exhibits no distension. There is no tenderness. There is no rebound and no guarding.  Musculoskeletal: She exhibits no edema or tenderness.  Neurological: She is alert and oriented to person, place, and time. She has normal strength. No cranial nerve deficit or sensory deficit. Coordination normal.  GCS eye subscore is 4. GCS verbal subscore is 5. GCS motor subscore is 6. She displays no Babinski's sign on the right side. She displays no Babinski's sign on the left side.  Reflex Scores:      Tricep reflexes are 2+ on the right side and 2+ on the left side.      Bicep reflexes are 2+ on the right side and 2+ on the left side.      Brachioradialis reflexes are 2+ on the right side and 2+ on the left side.      Patellar reflexes are 2+ on the right side and 2+ on the left side.      Achilles reflexes are 2+ on the right side and 2+ on the left side. Due to orthopedic issues patient is unable to complete heel to shin. Chronic issues walking unchanged per family. Finger to nose intact.  Skin: Skin is warm and dry. She is not diaphoretic.  Psychiatric: She has a normal mood and affect. Her behavior is normal.  Nursing note and vitals reviewed.   ED Course  Procedures (including critical care time) Labs Review Labs Reviewed - No data to display  Imaging Review No results found. I have personally reviewed and evaluated these images and lab results as part of my medical decision-making.   EKG Interpretation None      MDM   Final diagnoses:  Cerumen impaction, bilateral    81 yo F with a chief complaint of bilateral hearing loss. Patient with significant wax impaction axis so hard it was difficult to move with a curette. With Colace to try and dissolve the earwax will reassess. Feel that central causes somewhat unlikely as it is bilateral and the patient has no other neurologic symptoms.  Colace used to loosen earwax bilaterally. Patient with sudden return of her hearing. Feels like the noises now are too loud. Will discharge home.    I have discussed the diagnosis/risks/treatment options with the patient and family and believe the pt to be eligible for discharge home to follow-up with PCP. We also discussed returning to the ED immediately if new or worsening sx occur. We discussed  the sx which are most concerning (e.g., sudden recurrent hearing loss) that necessitate immediate return. Medications administered to the patient during their visit and any new prescriptions provided to the patient are listed below.  Medications given during this visit Medications  docusate sodium (COLACE) capsule 200 mg (200 mg Oral Given 12/16/14 1111)    Discharge Medication List as of 12/16/2014 12:15 PM      The patient appears reasonably screen and/or stabilized for discharge and I doubt any other medical condition or other [facility] requiring further screening, evaluation, or treatment in the ED at this time prior to discharge.     Melene Planan Naresh Althaus, DO 12/16/14 [phone]

## 2014-12-16 NOTE — ED Notes (Signed)
Patient undressed from the waist up and placed in a gown

## 2014-12-16 NOTE — ED Notes (Signed)
Patient states she is able to hear bilaterally.  Patient answering questions appropriately.

## 2014-12-16 NOTE — Discharge Instructions (Signed)
You can use over the counter debrox drops for your ear wax.  Follow up with your doctor.  Cerumen Impaction The structures of the external ear canal secrete a waxy substance known as cerumen. Excess cerumen can build up in the ear canal, causing a condition known as cerumen impaction. Cerumen impaction can cause ear pain and disrupt the function of the ear. The rate of cerumen production differs for each individual. In certain individuals, the configuration of the ear canal may decrease his or her ability to naturally remove cerumen. CAUSES Cerumen impaction is caused by excessive cerumen production or buildup. RISK FACTORS  Frequent use of swabs to clean ears.  Having narrow ear canals.  Having eczema.  Being dehydrated. SIGNS AND SYMPTOMS  Diminished hearing.  Ear drainage.  Ear pain.  Ear itch. TREATMENT Treatment may involve:  Over-the-counter or prescription ear drops to soften the cerumen.  Removal of cerumen by a health care provider. This may be done with:  Irrigation with warm water. This is the most common method of removal.  Ear curettes and other instruments.  Surgery. This may be done in severe cases. HOME CARE INSTRUCTIONS  Take medicines only as directed by your health care provider.  Do not insert objects into the ear with the intent of cleaning the ear. PREVENTION  Do not insert objects into the ear, even with the intent of cleaning the ear. Removing cerumen as a part of normal hygiene is not necessary, and the use of swabs in the ear canal is not recommended.  Drink enough water to keep your urine clear or pale yellow.  Control your eczema if you have it. SEEK MEDICAL CARE IF:  You develop ear pain.  You develop bleeding from the ear.  The cerumen does not clear after you use ear drops as directed.   This information is not intended to replace advice given to you by your health care provider. Make sure you discuss any questions you have with  your health care provider.   Document Released: 02/12/2004 Document Revised: 01/25/2014 Document Reviewed: 08/21/2014 Elsevier Interactive Patient Education [employer]2016 [employer]

## 2014-12-16 NOTE — ED Notes (Signed)
Pt with sudden loss of hearing at around 0300, no weakness or symptoms
